# Patient Record
Sex: Male | Born: 2000
Health system: Southern US, Community
[De-identification: ages and names within clinical notes are randomized; demographics above are authoritative.]

## PROBLEM LIST (undated history)

## (undated) DIAGNOSIS — F909 Attention-deficit hyperactivity disorder, unspecified type: Secondary | ICD-10-CM

## (undated) DIAGNOSIS — H539 Unspecified visual disturbance: Secondary | ICD-10-CM

## (undated) DIAGNOSIS — M419 Scoliosis, unspecified: Secondary | ICD-10-CM

---
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
## 2022-01-11 ENCOUNTER — Other Ambulatory Visit (HOSPITAL_COMMUNITY): Payer: Self-pay

## 2022-01-11 MED ORDER — AMPHETAMINE-DEXTROAMPHET ER 20 MG PO CP24
20.0000 mg | ORAL_CAPSULE | Freq: Every morning | ORAL | 0 refills | Status: DC
Start: 2022-01-11 — End: 2022-02-26
  Filled 2022-01-11: qty 30, 30d supply, fill #0

## 2022-02-26 ENCOUNTER — Other Ambulatory Visit (HOSPITAL_COMMUNITY): Payer: Self-pay

## 2022-02-26 MED ORDER — AMPHETAMINE-DEXTROAMPHET ER 20 MG PO CP24
20.0000 mg | ORAL_CAPSULE | Freq: Every morning | ORAL | 0 refills | Status: DC
  Filled 2022-02-26: qty 30, 30d supply, fill #0

## 2022-02-27 ENCOUNTER — Other Ambulatory Visit (HOSPITAL_COMMUNITY): Payer: Self-pay

## 2022-05-10 ENCOUNTER — Other Ambulatory Visit (HOSPITAL_COMMUNITY): Payer: Self-pay

## 2022-05-10 MED ORDER — AMPHETAMINE-DEXTROAMPHET ER 20 MG PO CP24
20.0000 mg | ORAL_CAPSULE | Freq: Every morning | ORAL | 0 refills | Status: DC
  Filled 2022-05-10: qty 30, 30d supply, fill #0

## 2022-05-11 ENCOUNTER — Other Ambulatory Visit (HOSPITAL_COMMUNITY): Payer: Self-pay

## 2022-06-14 ENCOUNTER — Other Ambulatory Visit (HOSPITAL_COMMUNITY): Payer: Self-pay

## 2022-06-14 DIAGNOSIS — R634 Abnormal weight loss: Secondary | ICD-10-CM | POA: Diagnosis not present

## 2022-06-14 DIAGNOSIS — F909 Attention-deficit hyperactivity disorder, unspecified type: Secondary | ICD-10-CM | POA: Diagnosis not present

## 2022-06-14 MED ORDER — AMPHETAMINE-DEXTROAMPHET ER 20 MG PO CP24
20.0000 mg | ORAL_CAPSULE | ORAL | 0 refills | Status: AC
  Filled 2022-06-14 (×2): qty 30, 30d supply, fill #0

## 2022-06-15 ENCOUNTER — Other Ambulatory Visit: Payer: Self-pay

## 2022-11-02 ENCOUNTER — Other Ambulatory Visit (HOSPITAL_COMMUNITY): Payer: Self-pay

## 2022-11-02 ENCOUNTER — Other Ambulatory Visit: Payer: Self-pay

## 2022-11-02 DIAGNOSIS — H5213 Myopia, bilateral: Secondary | ICD-10-CM | POA: Diagnosis not present

## 2022-11-02 MED ORDER — AMPHETAMINE-DEXTROAMPHET ER 20 MG PO CP24
20.0000 mg | ORAL_CAPSULE | Freq: Every morning | ORAL | 0 refills | Status: DC
  Filled 2022-11-02 – 2022-11-15 (×2): qty 30, 30d supply, fill #0

## 2022-11-09 ENCOUNTER — Other Ambulatory Visit: Payer: Self-pay

## 2022-11-09 ENCOUNTER — Other Ambulatory Visit (HOSPITAL_COMMUNITY): Payer: Self-pay

## 2022-11-12 ENCOUNTER — Other Ambulatory Visit: Payer: Self-pay

## 2022-11-15 ENCOUNTER — Other Ambulatory Visit (HOSPITAL_COMMUNITY): Payer: Self-pay

## 2022-11-16 ENCOUNTER — Other Ambulatory Visit (HOSPITAL_COMMUNITY): Payer: Self-pay

## 2022-11-16 ENCOUNTER — Other Ambulatory Visit: Payer: Self-pay

## 2023-01-05 ENCOUNTER — Other Ambulatory Visit: Payer: Self-pay

## 2023-01-05 ENCOUNTER — Other Ambulatory Visit (HOSPITAL_COMMUNITY): Payer: Self-pay

## 2023-01-05 MED ORDER — AMPHETAMINE-DEXTROAMPHET ER 20 MG PO CP24
20.0000 mg | ORAL_CAPSULE | Freq: Every morning | ORAL | 0 refills | Status: DC
  Filled 2023-01-05: qty 30, 30d supply, fill #0

## 2023-01-06 ENCOUNTER — Other Ambulatory Visit: Payer: Self-pay

## 2023-01-10 ENCOUNTER — Other Ambulatory Visit: Payer: Self-pay

## 2023-01-11 ENCOUNTER — Other Ambulatory Visit: Payer: Self-pay

## 2023-01-14 ENCOUNTER — Other Ambulatory Visit: Payer: Self-pay

## 2023-01-17 ENCOUNTER — Other Ambulatory Visit (HOSPITAL_COMMUNITY): Payer: Self-pay

## 2023-02-03 ENCOUNTER — Other Ambulatory Visit: Payer: Self-pay

## 2023-02-03 ENCOUNTER — Other Ambulatory Visit (HOSPITAL_COMMUNITY): Payer: Self-pay

## 2023-02-03 DIAGNOSIS — Z0000 Encounter for general adult medical examination without abnormal findings: Secondary | ICD-10-CM | POA: Diagnosis not present

## 2023-02-03 DIAGNOSIS — Z7189 Other specified counseling: Secondary | ICD-10-CM | POA: Diagnosis not present

## 2023-02-03 DIAGNOSIS — Z113 Encounter for screening for infections with a predominantly sexual mode of transmission: Secondary | ICD-10-CM | POA: Diagnosis not present

## 2023-02-03 DIAGNOSIS — Z681 Body mass index (BMI) 19 or less, adult: Secondary | ICD-10-CM | POA: Diagnosis not present

## 2023-02-03 DIAGNOSIS — Z713 Dietary counseling and surveillance: Secondary | ICD-10-CM | POA: Diagnosis not present

## 2023-02-03 DIAGNOSIS — F909 Attention-deficit hyperactivity disorder, unspecified type: Secondary | ICD-10-CM | POA: Diagnosis not present

## 2023-02-03 DIAGNOSIS — F32A Depression, unspecified: Secondary | ICD-10-CM | POA: Diagnosis not present

## 2023-02-03 MED ORDER — AMPHETAMINE-DEXTROAMPHET ER 20 MG PO CP24
20.0000 mg | ORAL_CAPSULE | Freq: Every morning | ORAL | 0 refills | Status: AC
  Filled 2023-02-03 – 2023-04-22 (×2): qty 30, 30d supply, fill #0

## 2023-04-12 ENCOUNTER — Other Ambulatory Visit (HOSPITAL_COMMUNITY): Payer: Self-pay

## 2023-04-12 MED ORDER — AMPHETAMINE-DEXTROAMPHET ER 20 MG PO CP24
20.0000 mg | ORAL_CAPSULE | Freq: Every morning | ORAL | 0 refills | Status: AC
  Filled 2023-04-12: qty 30, 30d supply, fill #0

## 2023-04-21 ENCOUNTER — Other Ambulatory Visit (HOSPITAL_COMMUNITY): Payer: Self-pay

## 2023-04-22 ENCOUNTER — Other Ambulatory Visit (HOSPITAL_COMMUNITY): Payer: Self-pay

## 2023-10-29 ENCOUNTER — Other Ambulatory Visit (HOSPITAL_COMMUNITY): Payer: Self-pay

## 2023-10-29 MED ORDER — AMPHETAMINE-DEXTROAMPHET ER 20 MG PO CP24
20.0000 mg | ORAL_CAPSULE | Freq: Every morning | ORAL | 0 refills | Status: DC
  Filled 2023-10-29 – 2023-11-29 (×2): qty 30, 30d supply, fill #0

## 2023-11-08 ENCOUNTER — Other Ambulatory Visit (HOSPITAL_COMMUNITY): Payer: Self-pay

## 2023-11-16 DIAGNOSIS — F909 Attention-deficit hyperactivity disorder, unspecified type: Secondary | ICD-10-CM | POA: Diagnosis not present

## 2023-11-29 ENCOUNTER — Other Ambulatory Visit (HOSPITAL_COMMUNITY): Payer: Self-pay

## 2023-12-02 ENCOUNTER — Other Ambulatory Visit (HOSPITAL_COMMUNITY): Payer: Self-pay

## 2023-12-15 DIAGNOSIS — Z7251 High risk heterosexual behavior: Secondary | ICD-10-CM | POA: Diagnosis not present

## 2023-12-15 DIAGNOSIS — Z118 Encounter for screening for other infectious and parasitic diseases: Secondary | ICD-10-CM | POA: Diagnosis not present

## 2023-12-15 DIAGNOSIS — R300 Dysuria: Secondary | ICD-10-CM | POA: Diagnosis not present

## 2024-02-02 ENCOUNTER — Other Ambulatory Visit (HOSPITAL_COMMUNITY): Payer: Self-pay

## 2024-02-02 MED ORDER — AMPHETAMINE-DEXTROAMPHET ER 20 MG PO CP24
20.0000 mg | ORAL_CAPSULE | Freq: Every morning | ORAL | 0 refills | Status: DC
  Filled 2024-02-02 – 2024-02-13 (×2): qty 30, 30d supply, fill #0

## 2024-02-12 ENCOUNTER — Other Ambulatory Visit (HOSPITAL_COMMUNITY): Payer: Self-pay

## 2024-02-13 ENCOUNTER — Other Ambulatory Visit (HOSPITAL_COMMUNITY): Payer: Self-pay

## 2024-04-11 ENCOUNTER — Other Ambulatory Visit (HOSPITAL_COMMUNITY): Payer: Self-pay

## 2024-04-11 ENCOUNTER — Other Ambulatory Visit: Payer: Self-pay

## 2024-04-11 MED ORDER — AMPHETAMINE-DEXTROAMPHET ER 20 MG PO CP24
20.0000 mg | ORAL_CAPSULE | Freq: Every morning | ORAL | 0 refills | Status: AC
  Filled 2024-04-11: qty 30, 30d supply, fill #0

## 2024-04-23 ENCOUNTER — Other Ambulatory Visit (HOSPITAL_COMMUNITY): Payer: Self-pay
